# Patient Record
Sex: Male | Born: 1955 | Race: Black or African American | Hispanic: No | Marital: Married | Smoking: Never smoker
Health system: Southern US, Community
[De-identification: ages and names within clinical notes are randomized; demographics above are authoritative.]

## PROBLEM LIST (undated history)

## (undated) DIAGNOSIS — H269 Unspecified cataract: Secondary | ICD-10-CM

## (undated) DIAGNOSIS — E785 Hyperlipidemia, unspecified: Secondary | ICD-10-CM

## (undated) DIAGNOSIS — Z9889 Other specified postprocedural states: Secondary | ICD-10-CM

## (undated) DIAGNOSIS — R112 Nausea with vomiting, unspecified: Secondary | ICD-10-CM

## (undated) HISTORY — PX: CIRCUMCISION: SUR203

## (undated) HISTORY — DX: Unspecified cataract: H26.9

## (undated) HISTORY — DX: Hyperlipidemia, unspecified: E78.5

## (undated) HISTORY — PX: COLONOSCOPY: SHX174

---

## 2001-10-12 ENCOUNTER — Other Ambulatory Visit: Admission: RE | Admit: 2001-10-12 | Discharge: 2001-10-12 | Payer: Self-pay | Admitting: Urology

## 2001-10-17 ENCOUNTER — Encounter: Payer: Self-pay | Admitting: *Deleted

## 2001-10-17 ENCOUNTER — Emergency Department (HOSPITAL_COMMUNITY): Admission: EM | Admit: 2001-10-17 | Discharge: 2001-10-17 | Payer: Self-pay | Admitting: *Deleted

## 2001-10-18 ENCOUNTER — Encounter: Payer: Self-pay | Admitting: Internal Medicine

## 2001-10-18 ENCOUNTER — Ambulatory Visit (HOSPITAL_COMMUNITY): Admission: RE | Admit: 2001-10-18 | Discharge: 2001-10-18 | Payer: Self-pay | Admitting: Internal Medicine

## 2001-10-19 ENCOUNTER — Ambulatory Visit (HOSPITAL_COMMUNITY): Admission: RE | Admit: 2001-10-19 | Discharge: 2001-10-19 | Payer: Self-pay | Admitting: Internal Medicine

## 2003-12-14 ENCOUNTER — Emergency Department (HOSPITAL_COMMUNITY): Admission: EM | Admit: 2003-12-14 | Discharge: 2003-12-14 | Payer: Self-pay | Admitting: Emergency Medicine

## 2005-01-29 ENCOUNTER — Ambulatory Visit (HOSPITAL_COMMUNITY): Admission: RE | Admit: 2005-01-29 | Discharge: 2005-01-29 | Payer: Self-pay | Admitting: Family Medicine

## 2005-02-07 ENCOUNTER — Ambulatory Visit (HOSPITAL_COMMUNITY): Admission: RE | Admit: 2005-02-07 | Discharge: 2005-02-07 | Payer: Self-pay | Admitting: Internal Medicine

## 2007-03-02 ENCOUNTER — Ambulatory Visit (HOSPITAL_COMMUNITY): Admission: RE | Admit: 2007-03-02 | Discharge: 2007-03-02 | Payer: Self-pay | Admitting: Urology

## 2009-12-15 HISTORY — PX: POLYPECTOMY: SHX149

## 2010-03-04 ENCOUNTER — Encounter: Payer: Self-pay | Admitting: Internal Medicine

## 2010-03-11 ENCOUNTER — Ambulatory Visit: Payer: Self-pay | Admitting: Internal Medicine

## 2010-03-11 ENCOUNTER — Ambulatory Visit (HOSPITAL_COMMUNITY): Admission: RE | Admit: 2010-03-11 | Discharge: 2010-03-11 | Payer: Self-pay | Admitting: Internal Medicine

## 2010-03-13 ENCOUNTER — Encounter: Payer: Self-pay | Admitting: Internal Medicine

## 2011-01-14 NOTE — Letter (Signed)
Summary: Internal Other Domingo Dimes  Internal Other Domingo Dimes   Imported By: Cloria Spring LPN 84/69/6295 28:41:32  _____________________________________________________________________  External Attachment:    Type:   Image     Comment:   External Document

## 2011-01-14 NOTE — Letter (Signed)
Summary: Patient Notice, Colon Biopsy Results  [facility]  [address]   [place], [place] [address]   Phone: [phone]  Fax: [phone]       March 13, 2010   Brandon Morris [address] [place], [place]  [address] 30-Jun-1956    Dear [name],  I am pleased to inform you that the biopsies taken during your recent colonoscopy did not show any evidence of cancer upon pathologic examination.  Additional information/recommendations:  No further action is needed at this time.  Please follow-up with your primary care physician for your other healthcare needs.  You should have a repeat colonoscopy examination  in 5 years.  Please call us if you are having persistent problems or have questions about your condition that have not been fully answered at this time.  Sincerely,    R. Roetta Sessions MD, FACP [facility] Ph: [phone]    Fax: [phone]   Appended Document: Patient Notice, Colon Biopsy Results letter mailed to pt

## 2011-05-15 ENCOUNTER — Emergency Department (HOSPITAL_COMMUNITY): Payer: Worker's Compensation

## 2011-05-15 ENCOUNTER — Emergency Department (HOSPITAL_COMMUNITY)
Admission: EM | Admit: 2011-05-15 | Discharge: 2011-05-15 | Disposition: A | Discharge status: Home / Self Care | Payer: Worker's Compensation | Attending: Emergency Medicine | Admitting: Emergency Medicine

## 2011-05-15 DIAGNOSIS — E7800 Pure hypercholesterolemia, unspecified: Secondary | ICD-10-CM | POA: Insufficient documentation

## 2011-05-15 DIAGNOSIS — M25569 Pain in unspecified knee: Secondary | ICD-10-CM | POA: Insufficient documentation

## 2011-05-15 DIAGNOSIS — M25469 Effusion, unspecified knee: Secondary | ICD-10-CM | POA: Insufficient documentation

## 2011-05-27 ENCOUNTER — Ambulatory Visit: Payer: Self-pay | Admitting: Orthopedic Surgery

## 2013-03-30 ENCOUNTER — Other Ambulatory Visit (HOSPITAL_COMMUNITY): Payer: Self-pay | Admitting: Internal Medicine

## 2013-03-30 DIAGNOSIS — R319 Hematuria, unspecified: Secondary | ICD-10-CM

## 2013-04-04 ENCOUNTER — Ambulatory Visit (HOSPITAL_COMMUNITY)
Admission: RE | Admit: 2013-04-04 | Discharge: 2013-04-04 | Disposition: A | Discharge status: Home / Self Care | Payer: BC Managed Care – PPO | Source: Ambulatory Visit | Attending: Internal Medicine | Admitting: Internal Medicine

## 2013-04-04 ENCOUNTER — Encounter (HOSPITAL_COMMUNITY): Payer: Self-pay

## 2013-04-04 DIAGNOSIS — R3129 Other microscopic hematuria: Secondary | ICD-10-CM | POA: Insufficient documentation

## 2013-04-04 DIAGNOSIS — R300 Dysuria: Secondary | ICD-10-CM | POA: Insufficient documentation

## 2013-04-04 DIAGNOSIS — R319 Hematuria, unspecified: Secondary | ICD-10-CM

## 2013-04-04 MED ORDER — IOHEXOL 300 MG/ML  SOLN
100.0000 mL | Freq: Once | INTRAMUSCULAR | Status: AC | PRN
  Administered 2013-04-04: 100 mL via INTRAVENOUS

## 2013-05-03 ENCOUNTER — Ambulatory Visit (INDEPENDENT_AMBULATORY_CARE_PROVIDER_SITE_OTHER): Payer: BC Managed Care – PPO | Admitting: Urology

## 2013-05-03 DIAGNOSIS — N529 Male erectile dysfunction, unspecified: Secondary | ICD-10-CM

## 2013-05-03 DIAGNOSIS — R3129 Other microscopic hematuria: Secondary | ICD-10-CM

## 2014-06-21 ENCOUNTER — Other Ambulatory Visit (HOSPITAL_COMMUNITY): Payer: Self-pay | Admitting: Orthopedic Surgery

## 2014-06-21 ENCOUNTER — Ambulatory Visit (HOSPITAL_COMMUNITY)
Admission: RE | Admit: 2014-06-21 | Discharge: 2014-06-21 | Disposition: A | Discharge status: Home / Self Care | Payer: Managed Care, Other (non HMO) | Source: Ambulatory Visit | Attending: Orthopedic Surgery | Admitting: Orthopedic Surgery

## 2014-06-21 DIAGNOSIS — M545 Low back pain: Secondary | ICD-10-CM

## 2014-06-21 DIAGNOSIS — Z0389 Encounter for observation for other suspected diseases and conditions ruled out: Secondary | ICD-10-CM | POA: Insufficient documentation

## 2015-01-28 IMAGING — CR DG ORBITS FOR FOREIGN BODY
2 series · 2 of 2 positions shown · non-contrast
Comparison: None.

CLINICAL DATA: Metal working/exposure; clearance prior to MRI

EXAM:
ORBITS FOR FOREIGN BODY - 2 VIEW

[w waters (1 of 2)]
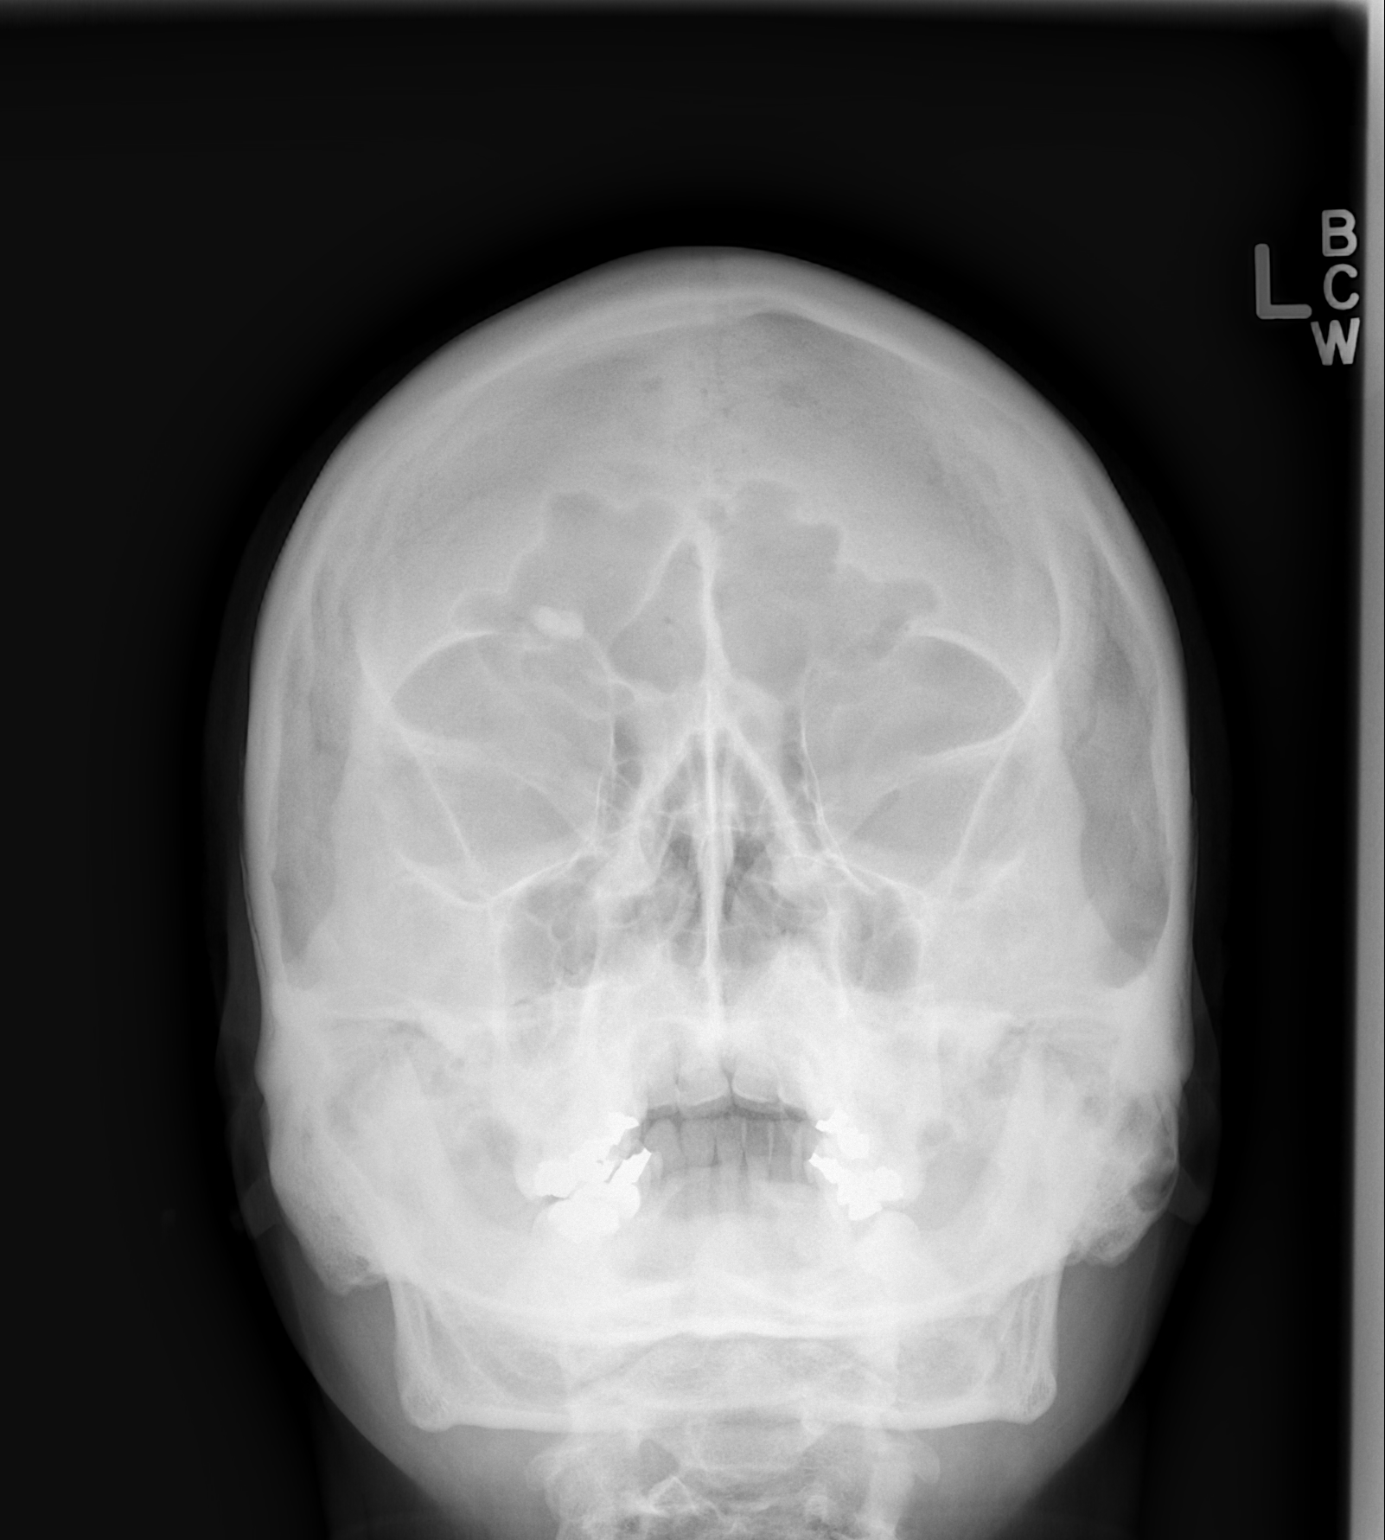

[w waters (2 of 2)]
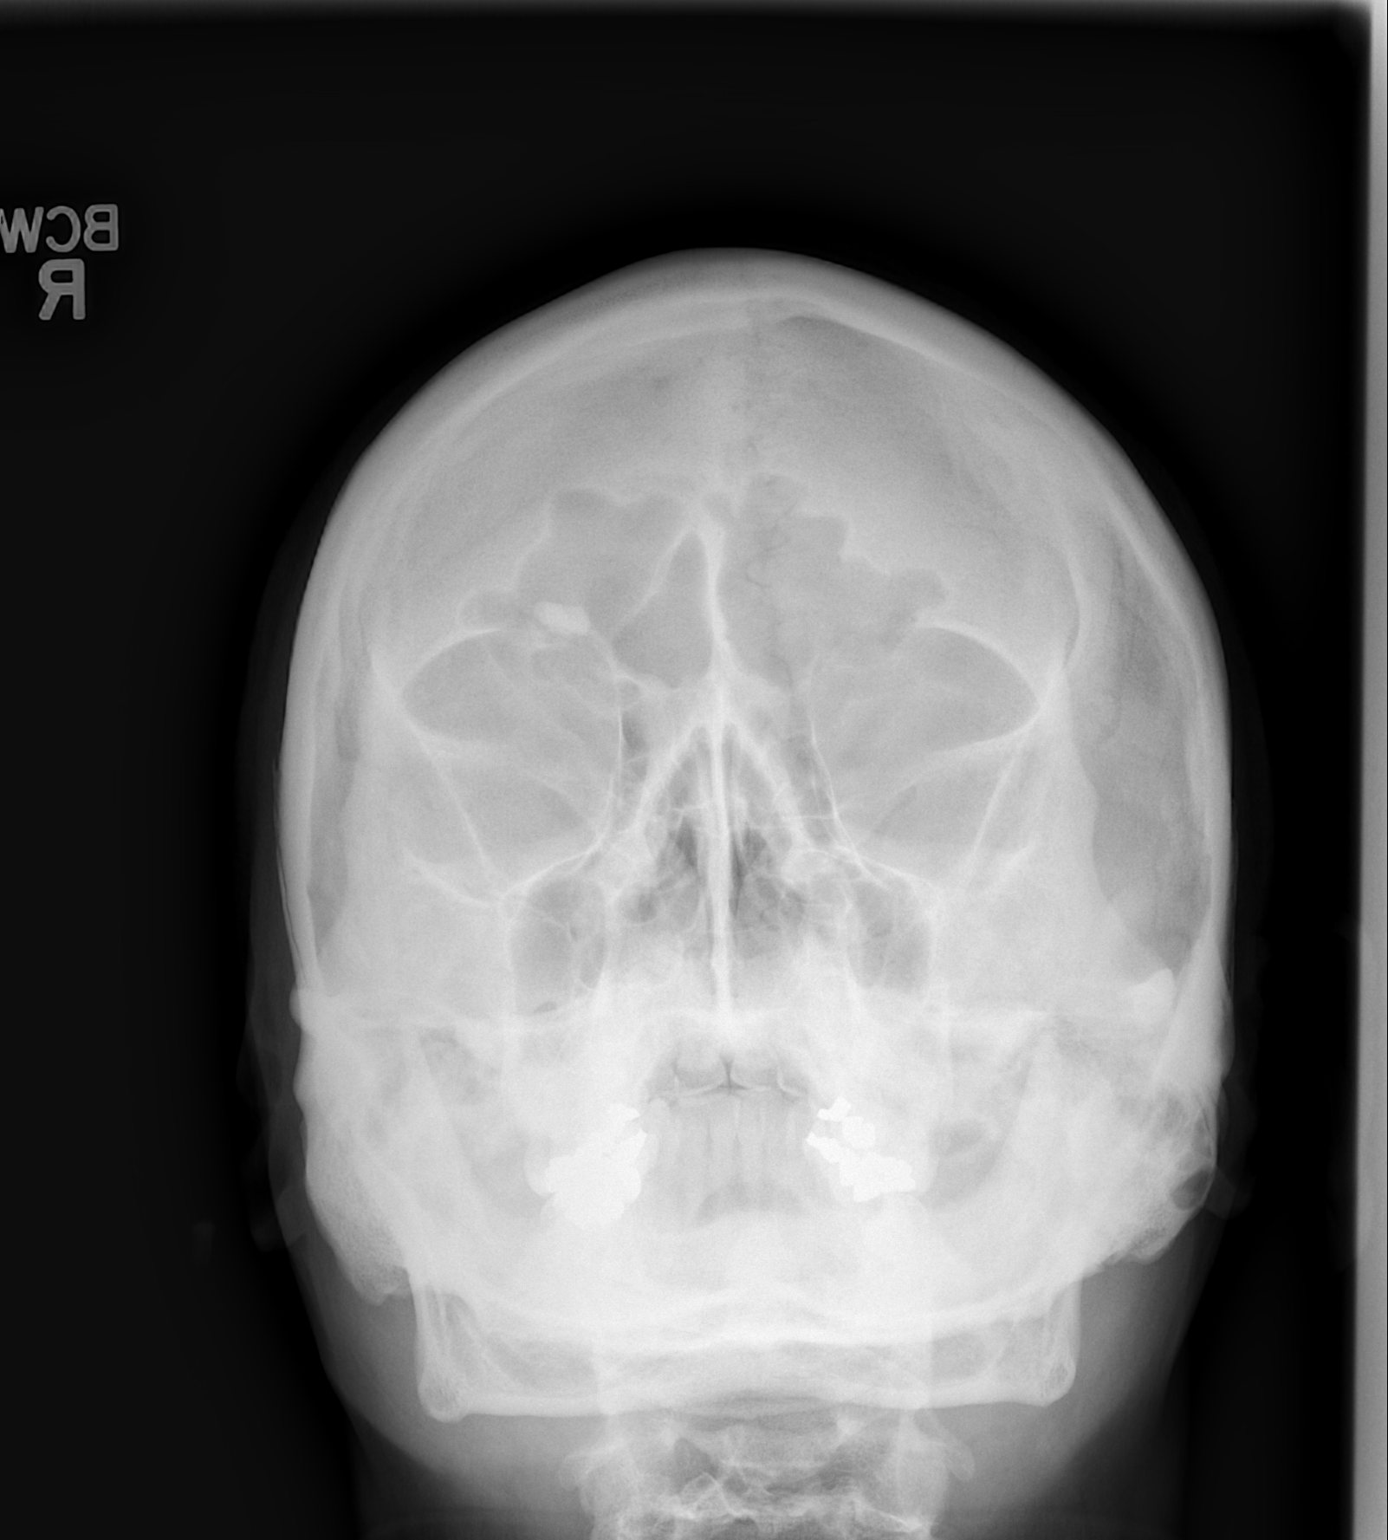

[2 of 2 positions shown; findings below may reference images not displayed]

FINDINGS: There is no evidence of metallic foreign body within the orbits.
Small sclerotic density noted over right frontal region most likely
frontal osteoma.
IMPRESSION: Small sclerotic density noted of the right frontal region most
likely a frontal osteoma. No metallic foreign bodies. Patient
cleared for MRI.

## 2016-01-21 ENCOUNTER — Encounter: Payer: Self-pay | Admitting: Internal Medicine

## 2016-01-30 ENCOUNTER — Ambulatory Visit: Payer: Self-pay | Admitting: Nurse Practitioner

## 2016-02-20 ENCOUNTER — Ambulatory Visit (INDEPENDENT_AMBULATORY_CARE_PROVIDER_SITE_OTHER): Payer: Self-pay | Admitting: Gastroenterology

## 2016-02-20 ENCOUNTER — Telehealth: Payer: Self-pay

## 2016-02-20 VITALS — BP 129/69 | HR 87 | Temp 97.6°F | Ht 67.0 in | Wt 178.6 lb

## 2016-02-20 DIAGNOSIS — Z1211 Encounter for screening for malignant neoplasm of colon: Secondary | ICD-10-CM

## 2016-02-20 NOTE — Telephone Encounter (Signed)
Pt was schedule for an office visit today but due to his insurance he is wanting to go to a free standing office. We gave him the phone number to [facility]. He is also going to call his PCP to let them know.

## 2016-02-27 DIAGNOSIS — Z1211 Encounter for screening for malignant neoplasm of colon: Secondary | ICD-10-CM | POA: Insufficient documentation

## 2016-02-27 NOTE — Progress Notes (Signed)
Patient not seen today. See telephone note.

## 2016-02-28 ENCOUNTER — Telehealth: Payer: Self-pay | Admitting: Gastroenterology

## 2016-02-28 NOTE — Telephone Encounter (Signed)
Received referral from [facility] to schedule colonoscopy.  Records have been placed on Dr. Corena Pilgrim desk for review.

## 2016-02-29 ENCOUNTER — Encounter: Payer: Self-pay | Admitting: Gastroenterology

## 2016-04-07 ENCOUNTER — Ambulatory Visit (AMBULATORY_SURGERY_CENTER): Payer: Self-pay | Admitting: *Deleted

## 2016-04-07 VITALS — Ht 67.0 in | Wt 179.0 lb

## 2016-04-07 DIAGNOSIS — Z86010 Personal history of colonic polyps: Secondary | ICD-10-CM

## 2016-04-07 NOTE — Progress Notes (Signed)
No egg or soy allergy known to patient  No issues with past sedation with any surgeries  or procedures, no intubation problems  No diet pills per patient No home 02 use per patient  No blood thinners per patient  Pt denies issues with constipation  emmi video to e mail  [email or web address]

## 2016-04-21 ENCOUNTER — Encounter: Payer: Self-pay | Admitting: Gastroenterology

## 2016-04-21 ENCOUNTER — Ambulatory Visit (AMBULATORY_SURGERY_CENTER): Payer: Managed Care, Other (non HMO) | Admitting: Gastroenterology

## 2016-04-21 VITALS — BP 105/63 | HR 56 | Temp 98.0°F | Resp 17 | Ht 67.0 in | Wt 179.0 lb

## 2016-04-21 DIAGNOSIS — Z86010 Personal history of colonic polyps: Secondary | ICD-10-CM

## 2016-04-21 DIAGNOSIS — D122 Benign neoplasm of ascending colon: Secondary | ICD-10-CM

## 2016-04-21 MED ORDER — SODIUM CHLORIDE 0.9 % IV SOLN: 500.0000 mL | INTRAVENOUS | Status: DC

## 2016-04-21 NOTE — Op Note (Signed)
[place] Patient Name: Brandon Morris Procedure Date: 04/21/2016 11:27 AM MRN: [id number] Endoscopist: Mallie Mussel L. Loletha Carrow , MD Age: 60 Date of Birth: 12/27/1955 Gender: Male Procedure:                Colonoscopy Indications:              Surveillance: Personal history of adenomatous                            polyps on last colonoscopy > 5 years ago (< 1cm                            Tubular adenoma - March, 2011) Medicines:                Monitored Anesthesia Care Procedure:                Pre-Anesthesia Assessment:                           - Prior to the procedure, a History and Physical                            was performed, and patient medications and                            allergies were reviewed. The patient's tolerance of                            previous anesthesia was also reviewed. The risks                            and benefits of the procedure and the sedation                            options and risks were discussed with the patient.                            All questions were answered, and informed consent                            was obtained. Prior Anticoagulants: The patient has                            taken no previous anticoagulant or antiplatelet                            agents. ASA Grade Assessment: II - A patient with                            mild systemic disease. After reviewing the risks                            and benefits, the patient was deemed in  satisfactory condition to undergo the procedure.                           After obtaining informed consent, the colonoscope                            was passed under direct vision. Throughout the                            procedure, the patient's blood pressure, pulse, and                            oxygen saturations were monitored continuously. The                            Model CF-HQ190L [id number]) scope was introduced       through the anus and advanced to the the cecum,                            identified by appendiceal orifice and ileocecal                            valve. The colonoscopy was performed without                            difficulty. The patient tolerated the procedure                            well. The quality of the bowel preparation was                            excellent. The ileocecal valve, appendiceal                            orifice, and rectum were photographed. The bowel                            preparation used was Miralax. Scope In: 11:40:19 AM Scope Out: 11:57:57 AM Scope Withdrawal Time: 0 hours 13 minutes 1 second  Total Procedure Duration: 0 hours 17 minutes 38 seconds  Findings:                 The perianal and digital rectal examinations were                            normal.                           A 4 mm polyp was found in the mid ascending colon.                            The polyp was sessile. The polyp was removed with a                            cold  snare. Resection and retrieval were complete.                           Internal hemorrhoids were found during                            retroflexion. The hemorrhoids were small and Grade                            I (internal hemorrhoids that do not prolapse).                           The exam was otherwise without abnormality. Complications:            No immediate complications. Estimated Blood Loss:     Estimated blood loss: none. Impression:               - One 4 mm polyp in the mid ascending colon,                            removed with a cold snare. Resected and retrieved.                           - Internal hemorrhoids.                           - The examination was otherwise normal. Recommendation:           - Patient has a contact number available for                            emergencies. The signs and symptoms of potential                            delayed complications were  discussed with the                            patient. Return to normal activities tomorrow.                            Written discharge instructions were provided to the                            patient.                           - Resume previous diet.                           - Continue present medications.                           - Await pathology results.                           - Repeat colonoscopy is recommended for  surveillance. The colonoscopy date will be                            determined after pathology results from today's                            exam become available for review. Henry L. Loletha Carrow, MD 04/21/2016 12:02:54 PM This report has been signed electronically.

## 2016-04-21 NOTE — Patient Instructions (Signed)

## 2016-04-21 NOTE — Progress Notes (Signed)
Called to room to assist during endoscopic procedure.  Patient ID and intended procedure confirmed with present staff. Received instructions for my participation in the procedure from the performing physician.  

## 2016-04-21 NOTE — Progress Notes (Signed)
To pacu vss patent aw report to rn 

## 2016-04-22 ENCOUNTER — Telehealth: Payer: Self-pay | Admitting: *Deleted

## 2016-04-22 NOTE — Telephone Encounter (Signed)
Left message that we called for f/u 

## 2016-04-25 ENCOUNTER — Encounter: Payer: Self-pay | Admitting: Gastroenterology

## 2016-06-16 NOTE — Telephone Encounter (Signed)
Appointments have been completed °

## 2018-09-15 NOTE — Patient Instructions (Signed)
Your procedure is scheduled on: 09/27/2018  Report to [facility] at  41   AM.  Call this number if you have problems the morning of surgery: [phone]   Do not eat food or drink liquids :After Midnight.      Take these medicines the morning of surgery with A SIP OF WATER: None   Do not wear jewelry, make-up or nail polish.  Do not wear lotions, powders, or perfumes. You may wear deodorant.  Do not shave 48 hours prior to surgery.  Do not bring valuables to the hospital.  Contacts, dentures or bridgework may not be worn into surgery.  Leave suitcase in the car. After surgery it may be brought to your room.  For patients admitted to the hospital, checkout time is 11:00 AM the day of discharge.   Patients discharged the day of surgery will not be allowed to drive home.  :     Please read over the following fact sheets that you were given: Coughing and Deep Breathing, Surgical Site Infection Prevention, Anesthesia Post-op Instructions and Care and Recovery After Surgery    Cataract A cataract is a clouding of the lens of the eye. When a lens becomes cloudy, vision is reduced based on the degree and nature of the clouding. Many cataracts reduce vision to some degree. Some cataracts make people more near-sighted as they develop. Other cataracts increase glare. Cataracts that are ignored and become worse can sometimes look white. The white color can be seen through the pupil. CAUSES   Aging. However, cataracts may occur at any age, even in newborns.   Certain drugs.   Trauma to the eye.   Certain diseases such as diabetes.   Specific eye diseases such as chronic inflammation inside the eye or a sudden attack of a rare form of glaucoma.   Inherited or acquired medical problems.  SYMPTOMS   Gradual, progressive drop in vision in the affected eye.   Severe, rapid visual loss. This most often happens when trauma is the cause.  DIAGNOSIS  To detect a cataract, an eye doctor examines  the lens. Cataracts are best diagnosed with an exam of the eyes with the pupils enlarged (dilated) by drops.  TREATMENT  For an early cataract, vision may improve by using different eyeglasses or stronger lighting. If that does not help your vision, surgery is the only effective treatment. A cataract needs to be surgically removed when vision loss interferes with your everyday activities, such as driving, reading, or watching TV. A cataract may also have to be removed if it prevents examination or treatment of another eye problem. Surgery removes the cloudy lens and usually replaces it with a substitute lens (intraocular lens, IOL).  At a time when both you and your doctor agree, the cataract will be surgically removed. If you have cataracts in both eyes, only one is usually removed at a time. This allows the operated eye to heal and be out of danger from any possible problems after surgery (such as infection or poor wound healing). In rare cases, a cataract may be doing damage to your eye. In these cases, your caregiver may advise surgical removal right away. The vast majority of people who have cataract surgery have better vision afterward. HOME CARE INSTRUCTIONS  If you are not planning surgery, you may be asked to do the following:  Use different eyeglasses.   Use stronger or brighter lighting.   Ask your eye doctor about reducing your medicine dose  or changing medicines if it is thought that a medicine caused your cataract. Changing medicines does not make the cataract go away on its own.   Become familiar with your surroundings. Poor vision can lead to injury. Avoid bumping into things on the affected side. You are at a higher risk for tripping or falling.   Exercise extreme care when driving or operating machinery.   Wear sunglasses if you are sensitive to bright light or experiencing problems with glare.  SEEK IMMEDIATE MEDICAL CARE IF:   You have a worsening or sudden vision loss.    You notice redness, swelling, or increasing pain in the eye.   You have a fever.  Document Released: 12/01/2005 Document Revised: 11/20/2011 Document Reviewed: 07/25/2011 [facility] Patient Information 2012 [place].PATIENT INSTRUCTIONS POST-ANESTHESIA  IMMEDIATELY FOLLOWING SURGERY:  Do not drive or operate machinery for the first twenty four hours after surgery.  Do not make any important decisions for twenty four hours after surgery or while taking narcotic pain medications or sedatives.  If you develop intractable nausea and vomiting or a severe headache please notify your doctor immediately.  FOLLOW-UP:  Please make an appointment with your surgeon as instructed. You do not need to follow up with anesthesia unless specifically instructed to do so.  WOUND CARE INSTRUCTIONS (if applicable):  Keep a dry clean dressing on the anesthesia/puncture wound site if there is drainage.  Once the wound has quit draining you may leave it open to air.  Generally you should leave the bandage intact for twenty four hours unless there is drainage.  If the epidural site drains for more than 36-48 hours please call the anesthesia department.  QUESTIONS?:  Please feel free to call your physician or the hospital operator if you have any questions, and they will be happy to assist you.

## 2018-09-20 ENCOUNTER — Encounter (HOSPITAL_COMMUNITY)
Admission: RE | Admit: 2018-09-20 | Discharge: 2018-09-20 | Disposition: A | Discharge status: Home / Self Care | Payer: Managed Care, Other (non HMO) | Source: Ambulatory Visit | Attending: Ophthalmology | Admitting: Ophthalmology

## 2018-09-20 DIAGNOSIS — Z01812 Encounter for preprocedural laboratory examination: Secondary | ICD-10-CM | POA: Insufficient documentation

## 2018-09-20 DIAGNOSIS — Z01810 Encounter for preprocedural cardiovascular examination: Secondary | ICD-10-CM | POA: Insufficient documentation

## 2018-09-20 DIAGNOSIS — I10 Essential (primary) hypertension: Secondary | ICD-10-CM | POA: Insufficient documentation

## 2018-09-20 LAB — BASIC METABOLIC PANEL
ANION GAP: 10 (ref 5–15)
BUN: 13 mg/dL (ref 8–23)
CALCIUM: 8.7 mg/dL — AB (ref 8.9–10.3)
CO2: 21 mmol/L — AB (ref 22–32)
Chloride: 106 mmol/L (ref 98–111)
Creatinine, Ser: 0.91 mg/dL (ref 0.61–1.24)
GFR calc Af Amer: >60 mL/min (ref >60)
Glucose, Bld: 104 mg/dL — ABNORMAL HIGH (ref 70–99)
Potassium: 3.9 mmol/L (ref 3.5–5.1)
Sodium: 137 mmol/L (ref 135–145)

## 2018-09-20 LAB — CBC
HCT: 41.4 % (ref 39.0–52.0)
HEMOGLOBIN: 13.8 g/dL (ref 13.0–17.0)
MCH: 31.4 pg (ref 26.0–34.0)
MCHC: 33.3 g/dL (ref 30.0–36.0)
MCV: 94.1 fL (ref 78.0–100.0)
PLATELETS: 293 10*3/uL (ref 150–400)
RBC: 4.4 MIL/uL (ref 4.22–5.81)
RDW: 11.9 % (ref 11.5–15.5)
WBC: 7.2 10*3/uL (ref 4.0–10.5)

## 2018-09-27 ENCOUNTER — Ambulatory Visit (HOSPITAL_COMMUNITY): Payer: Managed Care, Other (non HMO) | Admitting: Anesthesiology

## 2018-09-27 ENCOUNTER — Encounter (HOSPITAL_COMMUNITY)
Admission: RE | Disposition: A | Discharge status: Home / Self Care | Payer: Self-pay | Source: Ambulatory Visit | Attending: Ophthalmology

## 2018-09-27 ENCOUNTER — Encounter (HOSPITAL_COMMUNITY): Payer: Self-pay | Admitting: *Deleted

## 2018-09-27 ENCOUNTER — Ambulatory Visit (HOSPITAL_COMMUNITY)
Admission: RE | Admit: 2018-09-27 | Discharge: 2018-09-27 | Disposition: A | Discharge status: Home / Self Care | Payer: Managed Care, Other (non HMO) | Source: Ambulatory Visit | Attending: Ophthalmology | Admitting: Ophthalmology

## 2018-09-27 ENCOUNTER — Other Ambulatory Visit: Payer: Self-pay

## 2018-09-27 DIAGNOSIS — H25811 Combined forms of age-related cataract, right eye: Secondary | ICD-10-CM | POA: Insufficient documentation

## 2018-09-27 DIAGNOSIS — E7800 Pure hypercholesterolemia, unspecified: Secondary | ICD-10-CM | POA: Insufficient documentation

## 2018-09-27 HISTORY — PX: CATARACT EXTRACTION W/PHACO: SHX586

## 2018-09-27 SURGERY — PHACOEMULSIFICATION, CATARACT, WITH IOL INSERTION
Anesthesia: Monitor Anesthesia Care | Site: Eye | Laterality: Right

## 2018-09-27 MED ORDER — BSS IO SOLN
INTRAOCULAR | Status: DC | PRN
  Administered 2018-09-27: 15 mL via INTRAOCULAR

## 2018-09-27 MED ORDER — EPINEPHRINE PF 1 MG/ML IJ SOLN
INTRAOCULAR | Status: DC | PRN
  Administered 2018-09-27: 500 mL

## 2018-09-27 MED ORDER — LIDOCAINE HCL (PF) 1 % IJ SOLN
INTRAMUSCULAR | Status: DC | PRN
  Administered 2018-09-27: 1 mL

## 2018-09-27 MED ORDER — PROVISC 10 MG/ML IO SOLN
INTRAOCULAR | Status: DC | PRN
  Administered 2018-09-27: 0.85 mL via INTRAOCULAR

## 2018-09-27 MED ORDER — CYCLOPENTOLATE-PHENYLEPHRINE 0.2-1 % OP SOLN
1.0000 [drp] | OPHTHALMIC | Status: AC
  Administered 2018-09-27 (×3): 1 [drp] via OPHTHALMIC

## 2018-09-27 MED ORDER — MIDAZOLAM HCL 2 MG/2ML IJ SOLN
INTRAMUSCULAR | Status: AC
  Filled 2018-09-27: qty 2

## 2018-09-27 MED ORDER — LACTATED RINGERS IV SOLN
INTRAVENOUS | Status: DC
  Administered 2018-09-27: 12:00:00 via INTRAVENOUS

## 2018-09-27 MED ORDER — EPINEPHRINE PF 1 MG/ML IJ SOLN
INTRAMUSCULAR | Status: AC
  Filled 2018-09-27: qty 1

## 2018-09-27 MED ORDER — NEOMYCIN-POLYMYXIN-DEXAMETH 3.5-10000-0.1 OP SUSP
OPHTHALMIC | Status: DC | PRN
  Administered 2018-09-27: 2 [drp] via OPHTHALMIC

## 2018-09-27 MED ORDER — LIDOCAINE HCL 3.5 % OP GEL
1.0000 "application " | Freq: Once | OPHTHALMIC | Status: AC
  Administered 2018-09-27: 1 via OPHTHALMIC

## 2018-09-27 MED ORDER — TETRACAINE HCL 0.5 % OP SOLN
1.0000 [drp] | OPHTHALMIC | Status: AC
  Administered 2018-09-27 (×3): 1 [drp] via OPHTHALMIC

## 2018-09-27 MED ORDER — POVIDONE-IODINE 5 % OP SOLN
OPHTHALMIC | Status: DC | PRN
  Administered 2018-09-27: 1 via OPHTHALMIC

## 2018-09-27 MED ORDER — PHENYLEPHRINE HCL 2.5 % OP SOLN
1.0000 [drp] | OPHTHALMIC | Status: AC
  Administered 2018-09-27 (×3): 1 [drp] via OPHTHALMIC

## 2018-09-27 SURGICAL SUPPLY — 12 items
CLOTH BEACON ORANGE TIMEOUT ST (SAFETY) ×2 IMPLANT
EYE SHIELD UNIVERSAL CLEAR (GAUZE/BANDAGES/DRESSINGS) ×4 IMPLANT
GLOVE BIOGEL PI IND STRL 7.0 (GLOVE) IMPLANT
GLOVE BIOGEL PI INDICATOR 7.0 (GLOVE) ×4
LENS ALC ACRYL/TECN (Ophthalmic Related) ×2 IMPLANT
NDL HYPO 18GX1.5 BLUNT FILL (NEEDLE) IMPLANT
NEEDLE HYPO 18GX1.5 BLUNT FILL (NEEDLE) ×3 IMPLANT
PAD ARMBOARD 7.5X6 YLW CONV (MISCELLANEOUS) ×2 IMPLANT
SYRINGE LUER LOK 1CC (MISCELLANEOUS) ×2 IMPLANT
TAPE SURG TRANSPORE 1 IN (GAUZE/BANDAGES/DRESSINGS) IMPLANT
TAPE SURGICAL TRANSPORE 1 IN (GAUZE/BANDAGES/DRESSINGS) ×2
WATER STERILE IRR 250ML POUR (IV SOLUTION) ×2 IMPLANT

## 2018-09-27 NOTE — H&P (Signed)
I have reviewed the H&P, the patient was re-examined, and I have identified no interval changes in medical condition and plan of care since the history and physical of record  

## 2018-09-27 NOTE — Anesthesia Preprocedure Evaluation (Signed)
Anesthesia Evaluation  Patient identified by MRN, date of birth, ID band Patient awake    Reviewed: Allergy & Precautions, H&P , NPO status , Patient's Chart, lab work & pertinent test results  Airway Mallampati: II  TM Distance: >3 FB Neck ROM: full    Dental no notable dental hx.    Pulmonary neg pulmonary ROS,    Pulmonary exam normal breath sounds clear to auscultation       Cardiovascular Exercise Tolerance: Good negative cardio ROS   Rhythm:regular Rate:Normal     Neuro/Psych negative neurological ROS  negative psych ROS   GI/Hepatic negative GI ROS, Neg liver ROS,   Endo/Other  negative endocrine ROS  Renal/GU negative Renal ROS  negative genitourinary   Musculoskeletal   Abdominal   Peds  Hematology negative hematology ROS (+)   Anesthesia Other Findings   Reproductive/Obstetrics negative OB ROS                             Anesthesia Physical Anesthesia Plan  ASA: II  Anesthesia Plan: MAC   Post-op Pain Management:    Induction:   PONV Risk Score and Plan:   Airway Management Planned:   Additional Equipment:   Intra-op Plan:   Post-operative Plan:   Informed Consent: I have reviewed the patients History and Physical, chart, labs and discussed the procedure including the risks, benefits and alternatives for the proposed anesthesia with the patient or authorized representative who has indicated his/her understanding and acceptance.     Plan Discussed with: CRNA  Anesthesia Plan Comments:         Anesthesia Quick Evaluation

## 2018-09-27 NOTE — Discharge Instructions (Signed)
Monitored Anesthesia Care, Care After  These instructions provide you with information about caring for yourself after your procedure. Your health care provider may also give you more specific instructions. Your treatment has been planned according to current medical practices, but problems sometimes occur. Call your health care provider if you have any problems or questions after your procedure.  What can I expect after the procedure?  After your procedure, it is common to:   Feel sleepy for several hours.   Feel clumsy and have poor balance for several hours.   Feel forgetful about what happened after the procedure.   Have poor judgment for several hours.   Feel nauseous or vomit.   Have a sore throat if you had a breathing tube during the procedure.    Follow these instructions at home:  For at least 24 hours after the procedure:     Do not:  ? Participate in activities in which you could fall or become injured.  ? Drive.  ? Use heavy machinery.  ? Drink alcohol.  ? Take sleeping pills or medicines that cause drowsiness.  ? Make important decisions or sign legal documents.  ? Take care of children on your own.   Rest.  Eating and drinking   Follow the diet that is recommended by your health care provider.   If you vomit, drink water, juice, or soup when you can drink without vomiting.   Make sure you have little or no nausea before eating solid foods.  General instructions   Have a responsible adult stay with you until you are awake and alert.   Take over-the-counter and prescription medicines only as told by your health care provider.   If you smoke, do not smoke without supervision.   Keep all follow-up visits as told by your health care provider. This is important.  Contact a health care provider if:   You keep feeling nauseous or you keep vomiting.   You feel light-headed.   You develop a rash.   You have a fever.  Get help right away if:   You have trouble breathing.  This information is  not intended to replace advice given to you by your health care provider. Make sure you discuss any questions you have with your health care provider.  Document Released: 03/23/2016 Document Revised: 07/23/2016 Document Reviewed: 03/23/2016  Elsevier Interactive Patient Education  2018 [employer]

## 2018-09-27 NOTE — Op Note (Signed)
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  Assistants: None  Anesthesia: Topical with MAC  Indications: Painless, progressive loss of vision with compromise of daily activities.  Surgery: Cataract Extraction with Intraocular lens Implant Right Eye  Discription: The patient had dilating drops and viscous lidocaine placed into the Right eye in the pre-op holding area. After transfer to the operating room, a time out was performed. The patient was then prepped and draped. Beginning with a 60m blade a paracentesis port was made at the surgeon's 2 o'clock position. The anterior chamber was then filled with 1% non-preserved lidocaine. This was followed by filling the anterior chamber with Provisc.  A 2.420mkeratome blade was used to make a clear corneal incision at the temporal limbus.  A bent cystatome needle was used to create a continuous tear capsulotomy. Hydrodissection was performed with balanced salt solution on a Fine canula. The lens nucleus was then removed using the phacoemulsification handpiece. Residual cortex was removed with the I&A handpiece. The anterior chamber and capsular bag were refilled with Provisc. A posterior chamber intraocular lens was placed into the capsular bag with it's injector. The implant was positioned with the Kuglan hook. The Provisc was then removed from the anterior chamber and capsular bag with the I&A handpiece. Stromal hydration of the main incision and paracentesis port was performed with BSS on a Fine canula. The wounds were tested for leak which was negative. The patient tolerated the procedure well. There were no operative complications. The patient was then transferred to the recovery room in stable condition.  Complications: None  Specimen: None  EBL: None  Prosthetic device: J&J Technis, PCB00, power 19.0,  SN [id number]

## 2018-09-27 NOTE — Anesthesia Postprocedure Evaluation (Signed)
Anesthesia Post Note  Patient: Brandon Morris  Procedure(s) Performed: CATARACT EXTRACTION PHACO AND INTRAOCULAR LENS PLACEMENT (IOC) (Right Eye)  Patient location during evaluation: Short Stay Anesthesia Type: MAC Level of consciousness: awake and alert and oriented Pain management: pain level controlled Vital Signs Assessment: post-procedure vital signs reviewed and stable Respiratory status: spontaneous breathing Cardiovascular status: stable and blood pressure returned to baseline Postop Assessment: no apparent nausea or vomiting Anesthetic complications: no     Last Vitals:  Vitals:   09/27/18 1122  BP: 121/67  Pulse: (!) 55  Resp: 20  Temp: 36.6 C  SpO2: 97%    Last Pain:  Vitals:   09/27/18 1122  TempSrc: Oral  PainSc: 0-No pain                 Simon Llamas

## 2018-09-27 NOTE — Transfer of Care (Signed)
Immediate Anesthesia Transfer of Care Note  Patient: Brandon Morris  Procedure(s) Performed: CATARACT EXTRACTION PHACO AND INTRAOCULAR LENS PLACEMENT (IOC) (Right Eye)  Patient Location: Short Stay  Anesthesia Type:MAC  Level of Consciousness: awake  Airway & Oxygen Therapy: Patient Spontanous Breathing  Post-op Assessment: Report given to RN  Post vital signs: Reviewed  Last Vitals:  Vitals Value Taken Time  BP    Temp    Pulse    Resp    SpO2      Last Pain:  Vitals:   09/27/18 1122  TempSrc: Oral  PainSc: 0-No pain      Patients Stated Pain Goal: 7 (03/55/97 4163)  Complications: No apparent anesthesia complications

## 2018-09-28 ENCOUNTER — Encounter (HOSPITAL_COMMUNITY): Payer: Self-pay | Admitting: Ophthalmology

## 2018-11-02 ENCOUNTER — Encounter (HOSPITAL_COMMUNITY)
Admission: RE | Admit: 2018-11-02 | Discharge: 2018-11-02 | Disposition: A | Discharge status: Home / Self Care | Payer: Managed Care, Other (non HMO) | Source: Ambulatory Visit | Attending: Ophthalmology | Admitting: Ophthalmology

## 2018-11-08 ENCOUNTER — Ambulatory Visit (HOSPITAL_COMMUNITY): Payer: Managed Care, Other (non HMO) | Admitting: Certified Registered Nurse Anesthetist

## 2018-11-08 ENCOUNTER — Encounter (HOSPITAL_COMMUNITY)
Admission: RE | Disposition: A | Discharge status: Home / Self Care | Payer: Self-pay | Source: Ambulatory Visit | Attending: Ophthalmology

## 2018-11-08 ENCOUNTER — Encounter (HOSPITAL_COMMUNITY): Payer: Self-pay | Admitting: Ophthalmology

## 2018-11-08 ENCOUNTER — Ambulatory Visit (HOSPITAL_COMMUNITY)
Admission: RE | Admit: 2018-11-08 | Discharge: 2018-11-08 | Disposition: A | Discharge status: Home / Self Care | Payer: Managed Care, Other (non HMO) | Source: Ambulatory Visit | Attending: Ophthalmology | Admitting: Ophthalmology

## 2018-11-08 DIAGNOSIS — H25812 Combined forms of age-related cataract, left eye: Secondary | ICD-10-CM | POA: Insufficient documentation

## 2018-11-08 HISTORY — PX: CATARACT EXTRACTION W/PHACO: SHX586

## 2018-11-08 SURGERY — PHACOEMULSIFICATION, CATARACT, WITH IOL INSERTION
Anesthesia: Monitor Anesthesia Care | Site: Eye | Laterality: Left

## 2018-11-08 MED ORDER — POVIDONE-IODINE 5 % OP SOLN
OPHTHALMIC | Status: DC | PRN
  Administered 2018-11-08: 1 via OPHTHALMIC

## 2018-11-08 MED ORDER — EPINEPHRINE PF 1 MG/ML IJ SOLN
INTRAOCULAR | Status: DC | PRN
  Administered 2018-11-08: 500 mL

## 2018-11-08 MED ORDER — LIDOCAINE HCL 3.5 % OP GEL
1.0000 "application " | Freq: Once | OPHTHALMIC | Status: AC
  Administered 2018-11-08: 1 via OPHTHALMIC

## 2018-11-08 MED ORDER — LIDOCAINE HCL (PF) 1 % IJ SOLN
INTRAMUSCULAR | Status: DC | PRN
  Administered 2018-11-08: .5 mL

## 2018-11-08 MED ORDER — CYCLOPENTOLATE-PHENYLEPHRINE 0.2-1 % OP SOLN
1.0000 [drp] | OPHTHALMIC | Status: AC
  Administered 2018-11-08 (×3): 1 [drp] via OPHTHALMIC

## 2018-11-08 MED ORDER — PROVISC 10 MG/ML IO SOLN
INTRAOCULAR | Status: DC | PRN
  Administered 2018-11-08: 0.85 mL via INTRAOCULAR

## 2018-11-08 MED ORDER — TETRACAINE HCL 0.5 % OP SOLN
1.0000 [drp] | OPHTHALMIC | Status: AC
  Administered 2018-11-08 (×3): 1 [drp] via OPHTHALMIC

## 2018-11-08 MED ORDER — LACTATED RINGERS IV SOLN
INTRAVENOUS | Status: DC | PRN
  Administered 2018-11-08: 07:00:00 via INTRAVENOUS

## 2018-11-08 MED ORDER — PHENYLEPHRINE HCL 2.5 % OP SOLN
1.0000 [drp] | OPHTHALMIC | Status: AC
  Administered 2018-11-08 (×3): 1 [drp] via OPHTHALMIC

## 2018-11-08 MED ORDER — BSS IO SOLN
INTRAOCULAR | Status: DC | PRN
  Administered 2018-11-08: 15 mL

## 2018-11-08 MED ORDER — MIDAZOLAM HCL 2 MG/2ML IJ SOLN
INTRAMUSCULAR | Status: AC
  Filled 2018-11-08: qty 2

## 2018-11-08 MED ORDER — MIDAZOLAM HCL 5 MG/5ML IJ SOLN
INTRAMUSCULAR | Status: DC | PRN
  Administered 2018-11-08: 2 mg via INTRAVENOUS

## 2018-11-08 MED ORDER — NEOMYCIN-POLYMYXIN-DEXAMETH 3.5-10000-0.1 OP SUSP
OPHTHALMIC | Status: DC | PRN
  Administered 2018-11-08: 2 [drp] via OPHTHALMIC

## 2018-11-08 SURGICAL SUPPLY — 10 items

## 2018-11-08 NOTE — Anesthesia Preprocedure Evaluation (Signed)
Anesthesia Evaluation  Patient identified by MRN, date of birth, ID band Patient awake    Reviewed: Allergy & Precautions, H&P , NPO status , Patient's Chart, lab work & pertinent test results, reviewed documented beta blocker date and time   Airway Mallampati: II  TM Distance: >3 FB Neck ROM: full    Dental no notable dental hx.    Pulmonary neg pulmonary ROS,    Pulmonary exam normal breath sounds clear to auscultation       Cardiovascular Exercise Tolerance: Good negative cardio ROS   Rhythm:regular Rate:Normal     Neuro/Psych negative neurological ROS  negative psych ROS   GI/Hepatic negative GI ROS, Neg liver ROS,   Endo/Other  negative endocrine ROS  Renal/GU negative Renal ROS  negative genitourinary   Musculoskeletal   Abdominal   Peds  Hematology negative hematology ROS (+)   Anesthesia Other Findings   Reproductive/Obstetrics negative OB ROS                             Anesthesia Physical Anesthesia Plan  ASA: II  Anesthesia Plan: MAC   Post-op Pain Management:    Induction:   PONV Risk Score and Plan:   Airway Management Planned:   Additional Equipment:   Intra-op Plan:   Post-operative Plan:   Informed Consent: I have reviewed the patients History and Physical, chart, labs and discussed the procedure including the risks, benefits and alternatives for the proposed anesthesia with the patient or authorized representative who has indicated his/her understanding and acceptance.   Dental Advisory Given  Plan Discussed with: CRNA  Anesthesia Plan Comments:         Anesthesia Quick Evaluation

## 2018-11-08 NOTE — Op Note (Signed)
Date of Admission: 11/08/2018  Date of Surgery: 11/08/2018  Pre-Op Dx: Cataract Left  Eye  Post-Op Dx: Senile Combined Cataract  Left  Eye,  Dx Code V89.381  Surgeon: Tonny Branch, M.D.  Assistants: None  Anesthesia: Topical with MAC  Indications: Painless, progressive loss of vision with compromise of daily activities.  Surgery: Cataract Extraction with Intraocular lens Implant Left Eye  Discription: The patient had dilating drops and viscous lidocaine placed into the Left eye in the pre-op holding area. After transfer to the operating room, a time out was performed. The patient was then prepped and draped. Beginning with a 62m blade a paracentesis port was made at the surgeon's 2 o'clock position. The anterior chamber was then filled with 1% non-preserved lidocaine. This was followed by filling the anterior chamber with Provisc.  A 2.46mkeratome blade was used to make a clear corneal incision at the temporal limbus.  A bent cystatome needle was used to create a continuous tear capsulotomy. Hydrodissection was performed with balanced salt solution on a Fine canula. The lens nucleus was then removed using the phacoemulsification handpiece. Residual cortex was removed with the I&A handpiece. The anterior chamber and capsular bag were refilled with Provisc. A posterior chamber intraocular lens was placed into the capsular bag with it's injector. The implant was positioned with the Kuglan hook. The Provisc was then removed from the anterior chamber and capsular bag with the I&A handpiece. Stromal hydration of the main incision and paracentesis port was performed with BSS on a Fine canula. The wounds were tested for leak which was negative. The patient tolerated the procedure well. There were no operative complications. The patient was then transferred to the recovery room in stable condition.  Complications: None  Specimen: None  EBL: None  Prosthetic device: J&J Technis, PCB00, power 20.5, SN  [id number]

## 2018-11-08 NOTE — Transfer of Care (Signed)
Immediate Anesthesia Transfer of Care Note  Patient: Brandon Morris  Procedure(s) Performed: CATARACT EXTRACTION PHACO AND INTRAOCULAR LENS PLACEMENT ([place]) (Left Eye)  Patient Location: PACU  Anesthesia Type:MAC  Level of Consciousness: awake, alert  and oriented  Airway & Oxygen Therapy: Patient Spontanous Breathing  Post-op Assessment: Report given to RN and Post -op Vital signs reviewed and stable  Post vital signs: Reviewed and stable  Last Vitals:  Vitals Value Taken Time  BP 110/66 11/08/2018  7:41 AM  Temp 37 C 11/08/2018  7:41 AM  Pulse 57 11/08/2018  7:41 AM  Resp 16 11/08/2018  7:41 AM  SpO2 94 % 11/08/2018  7:41 AM    Last Pain:  Vitals:   11/08/18 0741  TempSrc: Oral  PainSc: 0-No pain         Complications: No apparent anesthesia complications

## 2018-11-08 NOTE — Anesthesia Postprocedure Evaluation (Signed)
Anesthesia Post Note  Patient: Brandon Morris  Procedure(s) Performed: CATARACT EXTRACTION PHACO AND INTRAOCULAR LENS PLACEMENT ([place]) (Left Eye)  Patient location during evaluation: PACU Anesthesia Type: MAC Level of consciousness: awake and awake and alert Pain management: pain level controlled Vital Signs Assessment: post-procedure vital signs reviewed and stable Respiratory status: spontaneous breathing and respiratory function stable Cardiovascular status: stable Postop Assessment: no headache and able to ambulate Anesthetic complications: no     Last Vitals:  Vitals:   11/08/18 0626 11/08/18 0741  BP: 115/74 110/66  Pulse: 63 (!) 57  Resp: 18 16  Temp: 36.9 C 37 C  SpO2: 100% 94%    Last Pain:  Vitals:   11/08/18 0742  TempSrc:   PainSc: 0-No pain                 Tanayah Squitieri C [place]

## 2018-11-08 NOTE — Discharge Instructions (Signed)

## 2018-11-08 NOTE — H&P (Signed)
I have reviewed the H&P, the patient was re-examined, and I have identified no interval changes in medical condition and plan of care since the history and physical of record  

## 2018-11-09 ENCOUNTER — Encounter (HOSPITAL_COMMUNITY): Payer: Self-pay | Admitting: Ophthalmology

## 2020-02-18 ENCOUNTER — Ambulatory Visit: Payer: Managed Care, Other (non HMO) | Attending: Internal Medicine

## 2020-02-18 DIAGNOSIS — Z23 Encounter for immunization: Secondary | ICD-10-CM | POA: Insufficient documentation

## 2020-02-18 NOTE — Progress Notes (Signed)
   Covid-19 Vaccination Clinic  Name:  JAMANI FAZZIO    MRN: [id number] DOB: 12/21/1955  02/18/2020  Mr. Schreckengost was observed post Covid-19 immunization for 15 minutes without incident. He was provided with Vaccine Information Sheet and instruction to access the V-Safe system.   Mr. Sega was instructed to call 911 with any severe reactions post vaccine: Marland Kitchen Difficulty breathing  . Swelling of face and throat  . A fast heartbeat  . A bad rash all over body  . Dizziness and weakness   Immunizations Administered    Name Date Dose VIS Date Route   Moderna COVID-19 Vaccine 02/18/2020  9:25 AM 0.5 mL 11/15/2019 Intramuscular   Manufacturer: Moderna   Lot: [id number]   [place]:[id number]

## 2020-03-21 ENCOUNTER — Ambulatory Visit: Payer: Managed Care, Other (non HMO) | Attending: Internal Medicine

## 2020-03-21 DIAGNOSIS — Z23 Encounter for immunization: Secondary | ICD-10-CM

## 2020-03-21 NOTE — Progress Notes (Signed)
   Covid-19 Vaccination Clinic  Name:  SAJED BRIDENBAUGH    MRN: [id number] DOB: Aug 17, 1956  03/21/2020  Mr. Polman was observed post Covid-19 immunization for 15 minutes without incident. He was provided with Vaccine Information Sheet and instruction to access the V-Safe system.   Mr. Turturro was instructed to call 911 with any severe reactions post vaccine: Marland Kitchen Difficulty breathing  . Swelling of face and throat  . A fast heartbeat  . A bad rash all over body  . Dizziness and weakness   Immunizations Administered    Name Date Dose VIS Date Route   Moderna COVID-19 Vaccine 03/21/2020 11:30 AM 0.5 mL 11/15/2019 Intramuscular   Manufacturer: Levan Hurst   LotUD:[id number]   [place]:[id number]

## 2020-03-22 ENCOUNTER — Ambulatory Visit: Payer: Self-pay

## 2020-04-03 DIAGNOSIS — Z6828 Body mass index (BMI) 28.0-28.9, adult: Secondary | ICD-10-CM | POA: Diagnosis not present

## 2020-04-03 DIAGNOSIS — Z1389 Encounter for screening for other disorder: Secondary | ICD-10-CM | POA: Diagnosis not present

## 2020-04-03 DIAGNOSIS — Z125 Encounter for screening for malignant neoplasm of prostate: Secondary | ICD-10-CM | POA: Diagnosis not present

## 2020-04-03 DIAGNOSIS — Z23 Encounter for immunization: Secondary | ICD-10-CM | POA: Diagnosis not present

## 2020-04-03 DIAGNOSIS — Z0000 Encounter for general adult medical examination without abnormal findings: Secondary | ICD-10-CM | POA: Diagnosis not present

## 2020-04-03 DIAGNOSIS — I10 Essential (primary) hypertension: Secondary | ICD-10-CM | POA: Diagnosis not present

## 2020-04-03 DIAGNOSIS — E7849 Other hyperlipidemia: Secondary | ICD-10-CM | POA: Diagnosis not present

## 2020-06-25 DIAGNOSIS — L648 Other androgenic alopecia: Secondary | ICD-10-CM | POA: Diagnosis not present

## 2020-06-25 DIAGNOSIS — L218 Other seborrheic dermatitis: Secondary | ICD-10-CM | POA: Diagnosis not present

## 2020-09-21 DIAGNOSIS — Z23 Encounter for immunization: Secondary | ICD-10-CM | POA: Diagnosis not present

## 2021-04-08 DIAGNOSIS — Z6828 Body mass index (BMI) 28.0-28.9, adult: Secondary | ICD-10-CM | POA: Diagnosis not present

## 2021-04-08 DIAGNOSIS — E7849 Other hyperlipidemia: Secondary | ICD-10-CM | POA: Diagnosis not present

## 2021-04-08 DIAGNOSIS — N419 Inflammatory disease of prostate, unspecified: Secondary | ICD-10-CM | POA: Diagnosis not present

## 2021-04-08 DIAGNOSIS — R300 Dysuria: Secondary | ICD-10-CM | POA: Diagnosis not present

## 2021-04-08 DIAGNOSIS — N029 Recurrent and persistent hematuria with unspecified morphologic changes: Secondary | ICD-10-CM | POA: Diagnosis not present

## 2021-04-08 DIAGNOSIS — I10 Essential (primary) hypertension: Secondary | ICD-10-CM | POA: Diagnosis not present

## 2021-04-08 DIAGNOSIS — Z125 Encounter for screening for malignant neoplasm of prostate: Secondary | ICD-10-CM | POA: Diagnosis not present

## 2021-04-08 DIAGNOSIS — Z0000 Encounter for general adult medical examination without abnormal findings: Secondary | ICD-10-CM | POA: Diagnosis not present

## 2021-04-08 DIAGNOSIS — Z1331 Encounter for screening for depression: Secondary | ICD-10-CM | POA: Diagnosis not present

## 2021-04-08 DIAGNOSIS — Z1389 Encounter for screening for other disorder: Secondary | ICD-10-CM | POA: Diagnosis not present

## 2021-05-01 DIAGNOSIS — E7849 Other hyperlipidemia: Secondary | ICD-10-CM | POA: Diagnosis not present

## 2021-05-01 DIAGNOSIS — B351 Tinea unguium: Secondary | ICD-10-CM | POA: Diagnosis not present

## 2021-05-01 DIAGNOSIS — J329 Chronic sinusitis, unspecified: Secondary | ICD-10-CM | POA: Diagnosis not present

## 2021-08-11 ENCOUNTER — Encounter: Payer: Self-pay | Admitting: Gastroenterology

## 2021-10-04 DIAGNOSIS — Z23 Encounter for immunization: Secondary | ICD-10-CM | POA: Diagnosis not present

## 2021-12-11 DIAGNOSIS — M545 Low back pain, unspecified: Secondary | ICD-10-CM | POA: Diagnosis not present

## 2022-04-09 DIAGNOSIS — E7849 Other hyperlipidemia: Secondary | ICD-10-CM | POA: Diagnosis not present

## 2022-04-09 DIAGNOSIS — I10 Essential (primary) hypertension: Secondary | ICD-10-CM | POA: Diagnosis not present

## 2022-04-09 DIAGNOSIS — Z0001 Encounter for general adult medical examination with abnormal findings: Secondary | ICD-10-CM | POA: Diagnosis not present

## 2022-04-10 ENCOUNTER — Encounter (INDEPENDENT_AMBULATORY_CARE_PROVIDER_SITE_OTHER): Payer: Self-pay | Admitting: *Deleted

## 2022-05-05 ENCOUNTER — Encounter (INDEPENDENT_AMBULATORY_CARE_PROVIDER_SITE_OTHER): Payer: Self-pay

## 2022-05-05 ENCOUNTER — Other Ambulatory Visit (INDEPENDENT_AMBULATORY_CARE_PROVIDER_SITE_OTHER): Payer: Self-pay

## 2022-05-05 DIAGNOSIS — Z1211 Encounter for screening for malignant neoplasm of colon: Secondary | ICD-10-CM

## 2022-05-14 ENCOUNTER — Telehealth (INDEPENDENT_AMBULATORY_CARE_PROVIDER_SITE_OTHER): Payer: Self-pay

## 2022-05-14 ENCOUNTER — Encounter (INDEPENDENT_AMBULATORY_CARE_PROVIDER_SITE_OTHER): Payer: Self-pay

## 2022-05-14 MED ORDER — PEG 3350-KCL-NA BICARB-NACL 420 G PO SOLR: 4000.0000 mL | ORAL | 0 refills | Status: DC

## 2022-05-14 NOTE — Telephone Encounter (Signed)
Maven Varelas Ann Maxx Calaway, CMA  ?

## 2022-05-14 NOTE — Telephone Encounter (Signed)
Referring MD/PCP: Gerarda Fraction  Procedure: Tcs  Reason/Indication:  hx of polyps  Has patient had this procedure before?  Yes   If so, when, by whom and where?  2017  Is there a family history of colon cancer?  no  Who?  What age when diagnosed?    Is patient diabetic? If yes, Type 1 or Type 2   no      Does patient have prosthetic heart valve or mechanical valve?  no  Do you have a pacemaker/defibrillator?  no  Has patient ever had endocarditis/atrial fibrillation? no  Does patient use oxygen? no  Has patient had joint replacement within last 12 months?  no  Is patient constipated or do they take laxatives? no  Does patient have a history of alcohol/drug use?  no  Have you had a stroke/heart attack last 6 mths? no  Do you take medicine for weight loss?  no  For male patients,: have you had a hysterectomy N/A                      are you post menopausal N/A                      do you still have your menstrual cycle N/A  Is patient on blood thinner such as Coumadin, Plavix and/or Aspirin? Yes   Medications: asa 81 mg daily  Allergies: nkda  Medication Adjustment per Dr Jenetta Downer none  Procedure date & time: 06/13/22 at 9:15

## 2022-05-14 NOTE — Telephone Encounter (Signed)
Brandon Morris Ann Clifton Kovacic, CMA  ?

## 2022-05-14 NOTE — Telephone Encounter (Signed)
Ok to schedule.  Thanks,  Edgel Degnan Castaneda Mayorga, MD Gastroenterology and Hepatology [facility]  

## 2022-05-15 ENCOUNTER — Encounter (INDEPENDENT_AMBULATORY_CARE_PROVIDER_SITE_OTHER): Payer: Self-pay | Admitting: *Deleted

## 2022-06-13 ENCOUNTER — Ambulatory Visit (HOSPITAL_BASED_OUTPATIENT_CLINIC_OR_DEPARTMENT_OTHER): Payer: Medicare Other | Admitting: Certified Registered"

## 2022-06-13 ENCOUNTER — Ambulatory Visit (HOSPITAL_COMMUNITY): Payer: Medicare Other | Admitting: Certified Registered"

## 2022-06-13 ENCOUNTER — Encounter (HOSPITAL_COMMUNITY): Payer: Self-pay | Admitting: Gastroenterology

## 2022-06-13 ENCOUNTER — Ambulatory Visit (HOSPITAL_COMMUNITY)
Admission: RE | Admit: 2022-06-13 | Discharge: 2022-06-13 | Disposition: A | Discharge status: Home / Self Care | Payer: Medicare Other | Attending: Gastroenterology | Admitting: Gastroenterology

## 2022-06-13 ENCOUNTER — Other Ambulatory Visit: Payer: Self-pay

## 2022-06-13 ENCOUNTER — Encounter (HOSPITAL_COMMUNITY)
Admission: RE | Disposition: A | Discharge status: Home / Self Care | Payer: Self-pay | Source: Home / Self Care | Attending: Gastroenterology

## 2022-06-13 DIAGNOSIS — K5730 Diverticulosis of large intestine without perforation or abscess without bleeding: Secondary | ICD-10-CM | POA: Insufficient documentation

## 2022-06-13 DIAGNOSIS — D120 Benign neoplasm of cecum: Secondary | ICD-10-CM | POA: Insufficient documentation

## 2022-06-13 DIAGNOSIS — K648 Other hemorrhoids: Secondary | ICD-10-CM | POA: Insufficient documentation

## 2022-06-13 DIAGNOSIS — Z1211 Encounter for screening for malignant neoplasm of colon: Secondary | ICD-10-CM

## 2022-06-13 DIAGNOSIS — D1779 Benign lipomatous neoplasm of other sites: Secondary | ICD-10-CM | POA: Insufficient documentation

## 2022-06-13 DIAGNOSIS — D125 Benign neoplasm of sigmoid colon: Secondary | ICD-10-CM | POA: Insufficient documentation

## 2022-06-13 DIAGNOSIS — D175 Benign lipomatous neoplasm of intra-abdominal organs: Secondary | ICD-10-CM | POA: Diagnosis not present

## 2022-06-13 DIAGNOSIS — Z09 Encounter for follow-up examination after completed treatment for conditions other than malignant neoplasm: Secondary | ICD-10-CM | POA: Diagnosis not present

## 2022-06-13 DIAGNOSIS — D122 Benign neoplasm of ascending colon: Secondary | ICD-10-CM | POA: Insufficient documentation

## 2022-06-13 DIAGNOSIS — E785 Hyperlipidemia, unspecified: Secondary | ICD-10-CM | POA: Diagnosis not present

## 2022-06-13 DIAGNOSIS — K635 Polyp of colon: Secondary | ICD-10-CM | POA: Diagnosis not present

## 2022-06-13 DIAGNOSIS — Z86010 Personal history of colonic polyps: Secondary | ICD-10-CM | POA: Insufficient documentation

## 2022-06-13 DIAGNOSIS — D123 Benign neoplasm of transverse colon: Secondary | ICD-10-CM | POA: Diagnosis not present

## 2022-06-13 HISTORY — DX: Other specified postprocedural states: R11.2

## 2022-06-13 HISTORY — PX: COLONOSCOPY WITH PROPOFOL: SHX5780

## 2022-06-13 HISTORY — PX: BIOPSY: SHX5522

## 2022-06-13 HISTORY — DX: Other specified postprocedural states: Z98.890

## 2022-06-13 HISTORY — PX: POLYPECTOMY: SHX5525

## 2022-06-13 LAB — HM COLONOSCOPY

## 2022-06-13 SURGERY — COLONOSCOPY WITH PROPOFOL
Anesthesia: General

## 2022-06-13 MED ORDER — STERILE WATER FOR IRRIGATION IR SOLN
Status: DC | PRN
  Administered 2022-06-13: 300 mL

## 2022-06-13 MED ORDER — PHENYLEPHRINE 80 MCG/ML (10ML) SYRINGE FOR IV PUSH (FOR BLOOD PRESSURE SUPPORT)
PREFILLED_SYRINGE | INTRAVENOUS | Status: DC | PRN
  Administered 2022-06-13 (×2): 80 ug via INTRAVENOUS

## 2022-06-13 MED ORDER — PROPOFOL 500 MG/50ML IV EMUL
INTRAVENOUS | Status: DC | PRN
  Administered 2022-06-13: 150 ug/kg/min via INTRAVENOUS

## 2022-06-13 MED ORDER — LACTATED RINGERS IV SOLN
INTRAVENOUS | Status: DC
  Administered 2022-06-13: 1000 mL via INTRAVENOUS

## 2022-06-13 MED ORDER — PHENYLEPHRINE 80 MCG/ML (10ML) SYRINGE FOR IV PUSH (FOR BLOOD PRESSURE SUPPORT)
PREFILLED_SYRINGE | INTRAVENOUS | Status: AC
  Filled 2022-06-13: qty 10

## 2022-06-13 MED ORDER — PROPOFOL 10 MG/ML IV BOLUS
INTRAVENOUS | Status: DC | PRN
  Administered 2022-06-13: 100 mg via INTRAVENOUS

## 2022-06-13 NOTE — Op Note (Signed)
[facility] Patient Name: Brandon Morris Procedure Date: 06/13/2022 8:54 AM MRN: [id number] Date of Birth: July 14, 1956 Attending MD: Maylon Peppers ,  CSN: [id number] Age: 66 Admit Type: Outpatient Procedure:                Colonoscopy Indications:              Surveillance: Personal history of adenomatous                            polyps on last colonoscopy > 5 years ago Providers:                Maylon Peppers, Lambert Mody, Bonnetta Barry,                            Technician Referring MD:              Medicines:                Monitored Anesthesia Care Complications:            No immediate complications. Estimated Blood Loss:     Estimated blood loss: none. Procedure:                Pre-Anesthesia Assessment:                           - Prior to the procedure, a History and Physical                            was performed, and patient medications, allergies                            and sensitivities were reviewed. The patient's                            tolerance of previous anesthesia was reviewed.                           - The risks and benefits of the procedure and the                            sedation options and risks were discussed with the                            patient. All questions were answered and informed                            consent was obtained.                           - ASA Grade Assessment: II - A patient with mild                            systemic disease.                           After obtaining informed consent, the colonoscope  was passed under direct vision. Throughout the                            procedure, the patient's blood pressure, pulse, and                            oxygen saturations were monitored continuously. The                            [id number]-HQ190L ([id number]) scope was introduced through                            the anus and advanced to the the cecum, identified                             by appendiceal orifice and ileocecal valve. The                            colonoscopy was performed without difficulty. The                            patient tolerated the procedure well. The quality                            of the bowel preparation was excellent. Scope In: 9:08:17 AM Scope Out: 9:35:48 AM Scope Withdrawal Time: 0 hours 17 minutes 9 seconds  Total Procedure Duration: 0 hours 27 minutes 31 seconds  Findings:      The perianal and digital rectal examinations were normal.      There was a small lipoma, in the ascending colon.      A few medium-mouthed diverticula were found in the ascending colon.      Three sessile polyps were found in the transverse colon and ascending       colon. The polyps were 1 to 2 mm in size. These polyps were removed with       a cold biopsy forceps. Resection and retrieval were complete.      Two sessile polyps were found in the sigmoid colon and transverse colon.       The polyps were 5 to 8 mm in size. These polyps were removed with a cold       snare. Resection and retrieval were complete.      Non-bleeding internal hemorrhoids were found during retroflexion. The       hemorrhoids were small. Impression:               - Small lipoma in the ascending colon.                           - Diverticulosis in the ascending colon.                           - Three 1 to 2 mm polyps in the transverse colon                            and in the ascending colon, removed with a  cold                            biopsy forceps. Resected and retrieved.                           - Two 5 to 8 mm polyps in the sigmoid colon and in                            the transverse colon, removed with a cold snare.                            Resected and retrieved.                           - Non-bleeding internal hemorrhoids. Moderate Sedation:      Per Anesthesia Care Recommendation:           - Discharge patient to home (ambulatory).                            - Resume previous diet.                           - Await pathology results.                           - Repeat colonoscopy for surveillance based on                            pathology results. Procedure Code(s):        --- Professional ---                           [id number], Colonoscopy, flexible; with removal of                            tumor(s), polyp(s), or other lesion(s) by snare                            technique                           45380, 33, Colonoscopy, flexible; with biopsy,                            single or multiple Diagnosis Code(s):        --- Professional ---                           Z86.010, Personal history of colonic polyps                           D17.5, Benign lipomatous neoplasm of                            intra-abdominal organs  K64.8, Other hemorrhoids                           K63.5, Polyp of colon                           K57.30, Diverticulosis of large intestine without                            perforation or abscess without bleeding CPT copyright 2019 American Medical Association. All rights reserved. The codes documented in this report are preliminary and upon coder review may  be revised to meet current compliance requirements. Maylon Peppers, MD Maylon Peppers,  06/13/2022 9:41:34 AM This report has been signed electronically. Number of Addenda: 0

## 2022-06-13 NOTE — Anesthesia Preprocedure Evaluation (Signed)
Anesthesia Evaluation  Patient identified by MRN, date of birth, ID band Patient awake    Reviewed: Allergy & Precautions, H&P , NPO status , Patient's Chart, lab work & pertinent test results, reviewed documented beta blocker date and time   History of Anesthesia Complications (+) PONV and history of anesthetic complications  Airway Mallampati: II  TM Distance: >3 FB Neck ROM: full    Dental no notable dental hx.    Pulmonary neg pulmonary ROS,    Pulmonary exam normal breath sounds clear to auscultation       Cardiovascular Exercise Tolerance: Good negative cardio ROS   Rhythm:regular Rate:Normal     Neuro/Psych negative neurological ROS  negative psych ROS   GI/Hepatic negative GI ROS, Neg liver ROS,   Endo/Other  negative endocrine ROS  Renal/GU negative Renal ROS  negative genitourinary   Musculoskeletal   Abdominal   Peds  Hematology negative hematology ROS (+)   Anesthesia Other Findings   Reproductive/Obstetrics negative OB ROS                             Anesthesia Physical Anesthesia Plan  ASA: 2  Anesthesia Plan: General   Post-op Pain Management:    Induction:   PONV Risk Score and Plan: Propofol infusion  Airway Management Planned:   Additional Equipment:   Intra-op Plan:   Post-operative Plan:   Informed Consent: I have reviewed the patients History and Physical, chart, labs and discussed the procedure including the risks, benefits and alternatives for the proposed anesthesia with the patient or authorized representative who has indicated his/her understanding and acceptance.     Dental Advisory Given  Plan Discussed with: CRNA  Anesthesia Plan Comments:         Anesthesia Quick Evaluation

## 2022-06-13 NOTE — Discharge Instructions (Signed)
You are being discharged to home.  Resume your previous diet.  We are waiting for your pathology results.  Your physician has recommended a repeat colonoscopy for surveillance based on pathology results.  

## 2022-06-13 NOTE — H&P (Signed)
Brandon Morris is an 66 y.o. male.   Chief Complaint: History of colonic polyps HPI: 66 year old male with past medical history of hyperlipidemia, coming for history of colonic polyps.  Last colonoscopy was performed in 2011, was found to have 1 cecal polyp which had adenomatous changes. The patient denies having any complaints such as melena, hematochezia, abdominal pain or distention, change in her bowel movement consistency or frequency, no changes in her weight recently.  No family history of colorectal cancer.   Past Medical History:  Diagnosis Date   Cataract    Hyperlipidemia    PONV (postoperative nausea and vomiting)     Past Surgical History:  Procedure Laterality Date   CATARACT EXTRACTION W/PHACO Right 09/27/2018   Procedure: CATARACT EXTRACTION PHACO AND INTRAOCULAR LENS PLACEMENT ([place]);  Surgeon: Tonny Branch, MD;  Location: [facility];  Service: Ophthalmology;  Laterality: Right;  CDE: 8.58   CATARACT EXTRACTION W/PHACO Left 11/08/2018   Procedure: CATARACT EXTRACTION PHACO AND INTRAOCULAR LENS PLACEMENT ([facility]);  Surgeon: Tonny Branch, MD;  Location: [facility];  Service: Ophthalmology;  Laterality: Left;  CDE: 4.31   CIRCUMCISION     COLONOSCOPY     POLYPECTOMY  12/15/2009    Family History  Problem Relation Age of Onset   Colon cancer Neg Hx    Colon polyps Neg Hx    Rectal cancer Neg Hx    Stomach cancer Neg Hx    Social History:  reports that he has never smoked. He has never used smokeless tobacco. He reports that he does not drink alcohol and does not use drugs.  Allergies: No Known Allergies  Medications Prior to Admission  Medication Sig Dispense Refill   Artificial Tear Solution (SOOTHE XP) SOLN Place 1 drop into both eyes daily as needed (dry eyes).     aspirin 81 MG tablet Take 81 mg by mouth daily.      BIOTIN PO Take 1 tablet by mouth daily.     Cholecalciferol (VITAMIN D) 50 MCG (2000 UT) CAPS Take 2,000 Units by mouth daily.      Coenzyme Q10 (CO Q-10)  100 MG CAPS Take 100 mg by mouth 3 (three) times a week.     Multiple Vitamins-Minerals (MULTIVITAMIN PO) Take 1 tablet by mouth daily. 50 plus     polyethylene glycol-electrolytes (TRILYTE) 420 g solution Take 4,000 mLs by mouth as directed. 4000 mL 0   vitamin B-12 (CYANOCOBALAMIN) 1000 MCG tablet Take 1,000 mcg by mouth daily.      No results found for this or any previous visit (from the past 48 hour(s)). No results found.  Review of Systems  All other systems reviewed and are negative.   Blood pressure 134/67, pulse 85, temperature 97.6 F (36.4 C), temperature source Oral, resp. rate 12, height '5\' 7"'$  (1.702 m), weight 79.4 kg, SpO2 98 %. Physical Exam  GENERAL: The patient is AO x3, in no acute distress. HEENT: Head is normocephalic and atraumatic. EOMI are intact. Mouth is well hydrated and without lesions. NECK: Supple. No masses LUNGS: Clear to auscultation. No presence of rhonchi/wheezing/rales. Adequate chest expansion HEART: RRR, normal s1 and s2. ABDOMEN: Soft, nontender, no guarding, no peritoneal signs, and nondistended. BS +. No masses. EXTREMITIES: Without any cyanosis, clubbing, rash, lesions or edema. NEUROLOGIC: AOx3, no focal motor deficit. SKIN: no jaundice, no rashes  Assessment/Plan 66 year old male with past medical history of hyperlipidemia, coming for history of colonic polyps.  We will proceed with colonoscopy.  Harvel Quale, MD  06/13/2022, 8:52 AM

## 2022-06-13 NOTE — Transfer of Care (Signed)
Immediate Anesthesia Transfer of Care Note  Patient: ASUNCION SHIBATA  Procedure(s) Performed: COLONOSCOPY WITH PROPOFOL POLYPECTOMY BIOPSY  Patient Location: Endoscopy Unit  Anesthesia Type:General  Level of Consciousness: awake  Airway & Oxygen Therapy: Patient Spontanous Breathing  Post-op Assessment: Report given to RN and Post -op Vital signs reviewed and stable  Post vital signs: Reviewed and stable  Last Vitals:  Vitals Value Taken Time  BP    Temp 36.4 C 06/13/22 0939  Pulse 67 06/13/22 0939  Resp 16 06/13/22 0939  SpO2 97 % 06/13/22 0939    Last Pain:  Vitals:   06/13/22 0939  TempSrc: Oral  PainSc: 0-No pain      Patients Stated Pain Goal: 7 (09/81/19 1478)  Complications: No notable events documented.

## 2022-06-14 NOTE — Anesthesia Postprocedure Evaluation (Signed)
Anesthesia Post Note  Patient: SAMARTH OGLE  Procedure(s) Performed: COLONOSCOPY WITH PROPOFOL POLYPECTOMY BIOPSY  Patient location during evaluation: Phase II Anesthesia Type: General Level of consciousness: awake Pain management: pain level controlled Vital Signs Assessment: post-procedure vital signs reviewed and stable Respiratory status: spontaneous breathing and respiratory function stable Cardiovascular status: blood pressure returned to baseline and stable Postop Assessment: no headache and no apparent nausea or vomiting Anesthetic complications: no Comments: Late entry   No notable events documented.   Last Vitals:  Vitals:   06/13/22 0939 06/13/22 0943  BP:  114/60  Pulse: 67   Resp: 16   Temp: 36.4 C   SpO2: 97%     Last Pain:  Vitals:   06/13/22 0939  TempSrc: Oral  PainSc: 0-No pain                 Louann Sjogren

## 2022-06-16 LAB — SURGICAL PATHOLOGY

## 2022-06-18 ENCOUNTER — Encounter (INDEPENDENT_AMBULATORY_CARE_PROVIDER_SITE_OTHER): Payer: Self-pay | Admitting: *Deleted

## 2022-06-19 ENCOUNTER — Encounter (HOSPITAL_COMMUNITY): Payer: Self-pay | Admitting: Gastroenterology

## 2022-10-24 DIAGNOSIS — Z23 Encounter for immunization: Secondary | ICD-10-CM | POA: Diagnosis not present

## 2023-04-13 DIAGNOSIS — E559 Vitamin D deficiency, unspecified: Secondary | ICD-10-CM | POA: Diagnosis not present

## 2023-04-13 DIAGNOSIS — Z0001 Encounter for general adult medical examination with abnormal findings: Secondary | ICD-10-CM | POA: Diagnosis not present

## 2023-04-13 DIAGNOSIS — D518 Other vitamin B12 deficiency anemias: Secondary | ICD-10-CM | POA: Diagnosis not present

## 2023-04-13 DIAGNOSIS — E782 Mixed hyperlipidemia: Secondary | ICD-10-CM | POA: Diagnosis not present

## 2023-04-13 DIAGNOSIS — I10 Essential (primary) hypertension: Secondary | ICD-10-CM | POA: Diagnosis not present

## 2023-04-13 DIAGNOSIS — G9332 Myalgic encephalomyelitis/chronic fatigue syndrome: Secondary | ICD-10-CM | POA: Diagnosis not present

## 2023-07-06 DIAGNOSIS — L918 Other hypertrophic disorders of the skin: Secondary | ICD-10-CM | POA: Diagnosis not present

## 2023-07-06 DIAGNOSIS — D485 Neoplasm of uncertain behavior of skin: Secondary | ICD-10-CM | POA: Diagnosis not present

## 2023-10-01 DIAGNOSIS — Z23 Encounter for immunization: Secondary | ICD-10-CM | POA: Diagnosis not present

## 2024-04-29 DIAGNOSIS — E7849 Other hyperlipidemia: Secondary | ICD-10-CM | POA: Diagnosis not present

## 2024-04-29 DIAGNOSIS — I10 Essential (primary) hypertension: Secondary | ICD-10-CM | POA: Diagnosis not present

## 2024-04-29 DIAGNOSIS — Z1389 Encounter for screening for other disorder: Secondary | ICD-10-CM | POA: Diagnosis not present

## 2024-04-29 DIAGNOSIS — Z0001 Encounter for general adult medical examination with abnormal findings: Secondary | ICD-10-CM | POA: Diagnosis not present

## 2024-04-29 DIAGNOSIS — D518 Other vitamin B12 deficiency anemias: Secondary | ICD-10-CM | POA: Diagnosis not present

## 2024-04-29 DIAGNOSIS — E559 Vitamin D deficiency, unspecified: Secondary | ICD-10-CM | POA: Diagnosis not present

## 2024-08-04 DIAGNOSIS — E782 Mixed hyperlipidemia: Secondary | ICD-10-CM | POA: Diagnosis not present

## 2024-08-04 DIAGNOSIS — Z79899 Other long term (current) drug therapy: Secondary | ICD-10-CM | POA: Diagnosis not present

## 2024-08-04 DIAGNOSIS — Z7689 Persons encountering health services in other specified circumstances: Secondary | ICD-10-CM | POA: Diagnosis not present

## 2024-08-30 DIAGNOSIS — E782 Mixed hyperlipidemia: Secondary | ICD-10-CM | POA: Diagnosis not present

## 2024-08-30 DIAGNOSIS — Z7689 Persons encountering health services in other specified circumstances: Secondary | ICD-10-CM | POA: Diagnosis not present

## 2024-09-05 DIAGNOSIS — R7303 Prediabetes: Secondary | ICD-10-CM | POA: Diagnosis not present

## 2024-09-05 DIAGNOSIS — E782 Mixed hyperlipidemia: Secondary | ICD-10-CM | POA: Diagnosis not present
